# Patient Record
Sex: Female | Born: 1990 | Race: Black or African American | Hispanic: No | Smoking: Current some day smoker
Health system: Southern US, Community
[De-identification: ages and names within clinical notes are randomized; demographics above are authoritative.]

## PROBLEM LIST (undated history)

## (undated) DIAGNOSIS — T50902A Poisoning by unspecified drugs, medicaments and biological substances, intentional self-harm, initial encounter: Secondary | ICD-10-CM

## (undated) DIAGNOSIS — S0181XA Laceration without foreign body of other part of head, initial encounter: Secondary | ICD-10-CM

## (undated) HISTORY — PX: NO PAST SURGERIES: SHX2092

---
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
## 2008-06-20 ENCOUNTER — Other Ambulatory Visit: Payer: Self-pay | Admitting: Emergency Medicine

## 2008-06-21 ENCOUNTER — Observation Stay (HOSPITAL_COMMUNITY): Admission: EM | Admit: 2008-06-21 | Discharge: 2008-06-21 | Payer: Self-pay | Admitting: Emergency Medicine

## 2008-08-04 ENCOUNTER — Ambulatory Visit (HOSPITAL_COMMUNITY): Admission: RE | Admit: 2008-08-04 | Discharge: 2008-08-04 | Payer: Self-pay | Admitting: Obstetrics & Gynecology

## 2008-09-20 ENCOUNTER — Ambulatory Visit: Payer: Self-pay | Admitting: Family Medicine

## 2008-09-20 ENCOUNTER — Inpatient Hospital Stay (HOSPITAL_COMMUNITY): Admission: AD | Admit: 2008-09-20 | Discharge: 2008-09-23 | Payer: Self-pay | Admitting: Obstetrics and Gynecology

## 2008-09-20 ENCOUNTER — Ambulatory Visit: Payer: Self-pay | Admitting: Obstetrics & Gynecology

## 2008-11-16 ENCOUNTER — Encounter: Admission: RE | Admit: 2008-11-16 | Discharge: 2008-11-16 | Payer: Self-pay | Admitting: Family Medicine
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
## 2011-09-12 LAB — BASIC METABOLIC PANEL
Calcium: 8.3 — ABNORMAL LOW
GFR calc Af Amer: >60
GFR calc non Af Amer: >60
Glucose, Bld: 91
Sodium: 137

## 2011-09-12 LAB — DIFFERENTIAL
Basophils Absolute: 0
Lymphocytes Relative: 28
Monocytes Absolute: 0.9
Neutro Abs: 6.2
Neutrophils Relative %: 62

## 2011-09-12 LAB — CBC
Hemoglobin: 8.6 — ABNORMAL LOW
RBC: 2.94 — ABNORMAL LOW
RDW: 13.9

## 2011-09-12 LAB — TYPE AND SCREEN: Antibody Screen: NEGATIVE

## 2011-09-12 LAB — ABO/RH: ABO/RH(D): O POS

## 2011-09-17 LAB — CBC
Hemoglobin: 10.5 — ABNORMAL LOW
Platelets: 212
RDW: 13.6

## 2011-09-17 LAB — RPR: RPR Ser Ql: NONREACTIVE

## 2011-12-07 ENCOUNTER — Encounter: Payer: Self-pay | Admitting: Emergency Medicine

## 2011-12-07 ENCOUNTER — Emergency Department (HOSPITAL_BASED_OUTPATIENT_CLINIC_OR_DEPARTMENT_OTHER)
Admission: EM | Admit: 2011-12-07 | Discharge: 2011-12-07 | Discharge status: Against Medical Advice | Payer: Self-pay | Attending: Emergency Medicine | Admitting: Emergency Medicine

## 2011-12-07 DIAGNOSIS — R109 Unspecified abdominal pain: Secondary | ICD-10-CM | POA: Insufficient documentation

## 2011-12-07 LAB — URINALYSIS, ROUTINE W REFLEX MICROSCOPIC
Bilirubin Urine: NEGATIVE
Ketones, ur: NEGATIVE mg/dL
Nitrite: NEGATIVE
Protein, ur: NEGATIVE mg/dL
Urobilinogen, UA: 1 mg/dL (ref 0.0–1.0)

## 2011-12-07 LAB — PREGNANCY, URINE: Preg Test, Ur: NEGATIVE

## 2011-12-07 NOTE — ED Notes (Signed)
Pt c/o middle lower pelvic pain, worse after intercourse; burning w urination; vaginal discharge

## 2012-08-21 ENCOUNTER — Emergency Department (HOSPITAL_COMMUNITY)
Admission: EM | Admit: 2012-08-21 | Discharge: 2012-08-21 | Disposition: A | Discharge status: Home / Self Care | Payer: Self-pay | Attending: Emergency Medicine | Admitting: Emergency Medicine

## 2012-08-21 ENCOUNTER — Emergency Department (HOSPITAL_COMMUNITY): Payer: Self-pay

## 2012-08-21 ENCOUNTER — Encounter (HOSPITAL_COMMUNITY): Payer: Self-pay

## 2012-08-21 DIAGNOSIS — IMO0002 Reserved for concepts with insufficient information to code with codable children: Secondary | ICD-10-CM | POA: Insufficient documentation

## 2012-08-21 DIAGNOSIS — W19XXXA Unspecified fall, initial encounter: Secondary | ICD-10-CM | POA: Insufficient documentation

## 2012-08-21 DIAGNOSIS — S8390XA Sprain of unspecified site of unspecified knee, initial encounter: Secondary | ICD-10-CM

## 2012-08-21 DIAGNOSIS — M25571 Pain in right ankle and joints of right foot: Secondary | ICD-10-CM

## 2012-08-21 DIAGNOSIS — M79609 Pain in unspecified limb: Secondary | ICD-10-CM | POA: Insufficient documentation

## 2012-08-21 DIAGNOSIS — M25579 Pain in unspecified ankle and joints of unspecified foot: Secondary | ICD-10-CM | POA: Insufficient documentation

## 2012-08-21 DIAGNOSIS — Y92009 Unspecified place in unspecified non-institutional (private) residence as the place of occurrence of the external cause: Secondary | ICD-10-CM | POA: Insufficient documentation

## 2012-08-21 DIAGNOSIS — M79671 Pain in right foot: Secondary | ICD-10-CM

## 2012-08-21 MED ORDER — HYDROCODONE-ACETAMINOPHEN 5-325 MG PO TABS
1.0000 | ORAL_TABLET | Freq: Once | ORAL | Status: AC
  Administered 2012-08-21: 1 via ORAL
  Filled 2012-08-21: qty 1

## 2012-08-21 MED ORDER — IBUPROFEN 800 MG PO TABS: 800.0000 mg | ORAL_TABLET | Freq: Three times a day (TID) | ORAL | Status: AC

## 2012-08-21 MED ORDER — HYDROCODONE-ACETAMINOPHEN 5-325 MG PO TABS: 1.0000 | ORAL_TABLET | Freq: Four times a day (QID) | ORAL | Status: AC | PRN

## 2012-08-21 NOTE — ED Provider Notes (Signed)
History     CSN: [id number]  Arrival date & time 08/21/12  1916   First MD Initiated Contact with Patient 08/21/12 2256      Chief Complaint  Patient presents with  . Leg Pain    (Consider location/radiation/quality/duration/timing/severity/associated sxs/prior treatment) Patient is a 21 y.o. female presenting with leg pain. The history is provided by the patient. No language interpreter was used.  Leg Pain  The incident occurred 6 to 12 hours ago. The incident occurred at home. The injury mechanism was a fall. The pain is present in the right knee, right ankle and right leg. The quality of the pain is described as throbbing. The pain is moderate. The pain has been fluctuating since onset. Associated symptoms include inability to bear weight. She reports no foreign bodies present. The symptoms are aggravated by activity, bearing weight and palpation. She has tried nothing for the symptoms.  patient walking in high heels, stumbled, falling onto hyper-flexed right leg.   History reviewed. No pertinent past medical history.  History reviewed. No pertinent past surgical history.  History reviewed. No pertinent family history.  History  Substance Use Topics  . Smoking status: Never Smoker   . Smokeless tobacco: Not on file  . Alcohol Use: Yes     social    OB History    Grav Para Term Preterm Abortions TAB SAB Ect Mult Living                  Review of Systems  Musculoskeletal: Positive for arthralgias.  All other systems reviewed and are negative.    Allergies  Review of patient's allergies indicates no known allergies.  Home Medications   Current Outpatient Rx  Name Route Sig Dispense Refill  . ACETAMINOPHEN 325 MG PO TABS Oral Take 325 mg by mouth every 6 (six) hours as needed. Headache      BP 126/71  Pulse 84  Temp 98.6 F (37 C) (Oral)  Resp 16  Ht 5\' 6"  (1.676 m)  Wt 165 lb (74.844 kg)  BMI 26.63 kg/m2  SpO2 100%  LMP 07/31/2012  Physical Exam    Constitutional: She is oriented to person, place, and time. She appears well-developed and well-nourished.  HENT:  Head: Normocephalic and atraumatic.  Eyes: Conjunctivae are normal. Pupils are equal, round, and reactive to light.  Neck: Normal range of motion. Neck supple.  Cardiovascular: Normal rate, regular rhythm and normal heart sounds.   Pulmonary/Chest: Effort normal and breath sounds normal.  Abdominal: Soft. Bowel sounds are normal.  Musculoskeletal: She exhibits tenderness.       Right knee: She exhibits decreased range of motion. She exhibits no swelling and no deformity.       Right ankle: She exhibits normal pulse. tenderness.       Feet:  Lymphadenopathy:    She has no cervical adenopathy.  Neurological: She is alert and oriented to person, place, and time.  Skin: Skin is warm and dry.  Psychiatric: She has a normal mood and affect. Her behavior is normal. Judgment and thought content normal.  No obvious swelling noted to ankle or knee.  Mild swelling noted to dorsal surface of foot at base of ankle.  ED Course  Procedures (including critical care time)  Labs Reviewed - No data to display Dg Ankle Complete Right  08/21/2012  *RADIOLOGY REPORT*  Clinical Data:  Fall.  Ankle injury and pain.  RIGHT ANKLE - COMPLETE 3+ VIEW  Comparison:  None.  Findings:  There is no evidence of fracture, dislocation, or joint effusion.  There is no evidence of arthropathy or other focal bone abnormality.  Soft tissues are unremarkable.  IMPRESSION: Negative.   Original Report Authenticated By: Danae Orleans, M.D.    Dg Knee Complete 4 Views Right  08/21/2012  *RADIOLOGY REPORT*  Clinical Data: Fall.  Knee injury and pain.  RIGHT KNEE - COMPLETE 4+ VIEW  Comparison:  None.  Findings:  There is no evidence of fracture, dislocation, or joint effusion.  There is no evidence of arthropathy or other focal bone abnormality.  Soft tissues are unremarkable.  IMPRESSION: Negative.   Original Report  Authenticated By: Danae Orleans, M.D.      No diagnosis found.    MDM  Hyperflexion of right knee, ankle and foot.  Xrays negative for fracture, dislocation, effusion.        Jimmye Norman, NP 08/22/12 [phone]

## 2012-08-21 NOTE — ED Notes (Signed)
Pt was in "very high-high heels" this am causing her to fall back.  She fell back onto her RT leg that was bent as she fell back.  C/O pain to her RT knee, leg, ankle and foot pain.

## 2012-08-22 NOTE — ED Provider Notes (Signed)
Medical screening examination/treatment/procedure(s) were performed by non-physician practitioner and as supervising physician I was immediately available for consultation/collaboration.  Diaz Crago, MD 08/22/12 0128 

## 2014-03-23 ENCOUNTER — Ambulatory Visit: Payer: Self-pay

## 2015-05-02 ENCOUNTER — Inpatient Hospital Stay (HOSPITAL_COMMUNITY)
Admission: AD | Admit: 2015-05-02 | Discharge: 2015-05-02 | Disposition: A | Discharge status: Home / Self Care | Payer: Medicaid Other | Source: Ambulatory Visit | Attending: Family Medicine | Admitting: Family Medicine

## 2015-05-02 ENCOUNTER — Encounter (HOSPITAL_COMMUNITY): Payer: Self-pay | Admitting: *Deleted

## 2015-05-02 DIAGNOSIS — A5901 Trichomonal vulvovaginitis: Secondary | ICD-10-CM | POA: Diagnosis not present

## 2015-05-02 DIAGNOSIS — N939 Abnormal uterine and vaginal bleeding, unspecified: Secondary | ICD-10-CM | POA: Insufficient documentation

## 2015-05-02 DIAGNOSIS — F17210 Nicotine dependence, cigarettes, uncomplicated: Secondary | ICD-10-CM | POA: Insufficient documentation

## 2015-05-02 HISTORY — DX: Laceration without foreign body of other part of head, initial encounter: S01.81XA

## 2015-05-02 HISTORY — DX: Poisoning by unspecified drugs, medicaments and biological substances, intentional self-harm, initial encounter: T50.902A

## 2015-05-02 LAB — CBC
HCT: 40 % (ref 36.0–46.0)
Hemoglobin: 13.3 g/dL (ref 12.0–15.0)
MCH: 29.5 pg (ref 26.0–34.0)
MCHC: 33.3 g/dL (ref 30.0–36.0)
MCV: 88.7 fL (ref 78.0–100.0)
PLATELETS: 237 10*3/uL (ref 150–400)
RBC: 4.51 MIL/uL (ref 3.87–5.11)
RDW: 14 % (ref 11.5–15.5)
WBC: 8.1 10*3/uL (ref 4.0–10.5)

## 2015-05-02 LAB — TSH: TSH: 0.831 u[IU]/mL (ref 0.350–4.500)

## 2015-05-02 LAB — WET PREP, GENITAL
Clue Cells Wet Prep HPF POC: NONE SEEN
Yeast Wet Prep HPF POC: NONE SEEN

## 2015-05-02 LAB — POCT PREGNANCY, URINE: PREG TEST UR: NEGATIVE

## 2015-05-02 MED ORDER — METRONIDAZOLE 500 MG PO TABS
2000.0000 mg | ORAL_TABLET | Freq: Once | ORAL | Status: AC
  Administered 2015-05-02: 2000 mg via ORAL
  Filled 2015-05-02: qty 4

## 2015-05-02 NOTE — MAU Note (Addendum)
PT SAYS SHE STARTED  VAG BLEEDING   AT    0930  WHILE AT  WORK  .    ALSO SAYS ON 5-8   SHE BLED HEAVY  FOR 1-2 DAYS.    NO BIRTH CONTROL.   LAST SEX-   4-16.     GYN-  CARE   IN GYN CLINIC-    [phone].   NO HPT.  IN TRIAGE  - NO PAD  -  UNDERWEAR BLACK-    DIFF  TO SEE -  NONE   ON    PANTS

## 2015-05-02 NOTE — Discharge Instructions (Signed)
Abnormal Uterine Bleeding °Abnormal uterine bleeding can affect women at various stages in life, including teenagers, women in their reproductive years, pregnant women, and women who have reached menopause. Several kinds of uterine bleeding are considered abnormal, including: °· Bleeding or spotting between periods.   °· Bleeding after sexual intercourse.   °· Bleeding that is heavier or more than normal.   °· Periods that last longer than usual. °· Bleeding after menopause.   °Many cases of abnormal uterine bleeding are minor and simple to treat, while others are more serious. Any type of abnormal bleeding should be evaluated by your health care provider. Treatment will depend on the cause of the bleeding. °HOME CARE INSTRUCTIONS °Monitor your condition for any changes. The following actions may help to alleviate any discomfort you are experiencing: °· Avoid the use of tampons and douches as directed by your health care provider. °· Change your pads frequently. °You should get regular pelvic exams and Pap tests. Keep all follow-up appointments for diagnostic tests as directed by your health care provider.  °SEEK MEDICAL CARE IF:  °· Your bleeding lasts more than 1 week.   °· You feel dizzy at times.   °SEEK IMMEDIATE MEDICAL CARE IF:  °· You pass out.   °· You are changing pads every 15 to 30 minutes.   °· You have abdominal pain. °· You have a fever.   °· You become sweaty or weak.   °· You are passing large blood clots from the vagina.   °· You start to feel nauseous and vomit. °MAKE SURE YOU:  °· Understand these instructions. °· Will watch your condition. °· Will get help right away if you are not doing well or get worse. °Document Released: 12/02/2005 Document Revised: 12/07/2013 Document Reviewed: 07/01/2013 °ExitCare® Patient Information ©2015 [employer]. This information is not intended to replace advice given to you by your health care provider. Make sure you discuss any questions you have with your  health care provider. ° ° °Trichomoniasis °Trichomoniasis is an infection caused by an organism called Trichomonas. The infection can affect both women and men. In women, the outer female genitalia and the vagina are affected. In men, the penis is mainly affected, but the prostate and other reproductive organs can also be involved. Trichomoniasis is a sexually transmitted infection (STI) and is most often passed to another person through sexual contact.  °RISK FACTORS °· Having unprotected sexual intercourse. °· Having sexual intercourse with an infected partner. °SIGNS AND SYMPTOMS  °Symptoms of trichomoniasis in women include: °· Abnormal gray-green frothy vaginal discharge. °· Itching and irritation of the vagina. °· Itching and irritation of the area outside the vagina. °Symptoms of trichomoniasis in men include:  °· Penile discharge with or without pain. °· Pain during urination. This results from inflammation of the urethra. °DIAGNOSIS  °Trichomoniasis may be found during a Pap test or physical exam. Your health care provider may use one of the following methods to help diagnose this infection: °· Examining vaginal discharge under a microscope. For men, urethral discharge would be examined. °· Testing the pH of the vagina with a test tape. °· Using a vaginal swab test that checks for the Trichomonas organism. A test is available that provides results within a few minutes. °· Doing a culture test for the organism. This is not usually needed. °TREATMENT  °· You may be given medicine to fight the infection. Women should inform their health care provider if they could be or are pregnant. Some medicines used to treat the infection should not be taken during   pregnancy. °· Your health care provider may recommend over-the-counter medicines or creams to decrease itching or irritation. °· Your sexual partner will need to be treated if infected. °HOME CARE INSTRUCTIONS  °· Take medicines only as directed by your health  care provider. °· Take over-the-counter medicine for itching or irritation as directed by your health care provider. °· Do not have sexual intercourse while you have the infection. °· Women should not douche or wear tampons while they have the infection. °· Discuss your infection with your partner. Your partner may have gotten the infection from you, or you may have gotten it from your partner. °· Have your sex partner get examined and treated if necessary. °· Practice safe, informed, and protected sex. °· See your health care provider for other STI testing. °SEEK MEDICAL CARE IF:  °· You still have symptoms after you finish your medicine. °· You develop abdominal pain. °· You have pain when you urinate. °· You have bleeding after sexual intercourse. °· You develop a rash. °· Your medicine makes you sick or makes you throw up (vomit). °MAKE SURE YOU: °· Understand these instructions. °· Will watch your condition. °· Will get help right away if you are not doing well or get worse. °Document Released: 05/28/2001 Document Revised: 04/18/2014 Document Reviewed: 09/13/2013 °ExitCare® Patient Information ©2015 [employer]. This information is not intended to replace advice given to you by your health care provider. Make sure you discuss any questions you have with your health care provider. ° °
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
## 2015-05-02 NOTE — MAU Note (Signed)
Urine in lab 

## 2015-05-03 LAB — GC/CHLAMYDIA PROBE AMP (~~LOC~~) NOT AT ARMC
Chlamydia: NEGATIVE
Neisseria Gonorrhea: NEGATIVE

## 2015-05-03 LAB — HIV ANTIBODY (ROUTINE TESTING W REFLEX): HIV Screen 4th Generation wRfx: NONREACTIVE

## 2016-11-19 ENCOUNTER — Emergency Department (HOSPITAL_COMMUNITY)
Admission: EM | Admit: 2016-11-19 | Discharge: 2016-11-19 | Disposition: A | Discharge status: Home / Self Care | Payer: Medicaid Other | Attending: Emergency Medicine | Admitting: Emergency Medicine

## 2016-11-19 ENCOUNTER — Emergency Department (HOSPITAL_COMMUNITY): Payer: Medicaid Other

## 2016-11-19 ENCOUNTER — Encounter (HOSPITAL_COMMUNITY): Payer: Self-pay | Admitting: Emergency Medicine

## 2016-11-19 DIAGNOSIS — R51 Headache: Secondary | ICD-10-CM | POA: Diagnosis not present

## 2016-11-19 DIAGNOSIS — S0181XA Laceration without foreign body of other part of head, initial encounter: Secondary | ICD-10-CM

## 2016-11-19 DIAGNOSIS — Y92481 Parking lot as the place of occurrence of the external cause: Secondary | ICD-10-CM | POA: Insufficient documentation

## 2016-11-19 DIAGNOSIS — S022XXA Fracture of nasal bones, initial encounter for closed fracture: Secondary | ICD-10-CM | POA: Diagnosis not present

## 2016-11-19 DIAGNOSIS — F17210 Nicotine dependence, cigarettes, uncomplicated: Secondary | ICD-10-CM | POA: Diagnosis not present

## 2016-11-19 DIAGNOSIS — Y939 Activity, unspecified: Secondary | ICD-10-CM | POA: Diagnosis not present

## 2016-11-19 DIAGNOSIS — S0083XA Contusion of other part of head, initial encounter: Secondary | ICD-10-CM | POA: Diagnosis not present

## 2016-11-19 DIAGNOSIS — S0121XA Laceration without foreign body of nose, initial encounter: Secondary | ICD-10-CM | POA: Diagnosis present

## 2016-11-19 DIAGNOSIS — Y999 Unspecified external cause status: Secondary | ICD-10-CM | POA: Diagnosis not present

## 2016-11-19 LAB — POC URINE PREG, ED: Preg Test, Ur: NEGATIVE

## 2016-11-19 MED ORDER — TRAMADOL HCL 50 MG PO TABS: 50.0000 mg | ORAL_TABLET | Freq: Four times a day (QID) | ORAL | 0 refills | Status: DC | PRN

## 2016-11-19 MED ORDER — LIDOCAINE HCL (PF) 1 % IJ SOLN
10.0000 mL | Freq: Once | INTRAMUSCULAR | Status: AC
  Administered 2016-11-19: 10 mL
  Filled 2016-11-19: qty 10

## 2016-11-19 MED ORDER — ACETAMINOPHEN 500 MG PO TABS
1000.0000 mg | ORAL_TABLET | Freq: Once | ORAL | Status: AC
  Administered 2016-11-19: 1000 mg via ORAL
  Filled 2016-11-19: qty 2

## 2016-11-19 MED ORDER — LIDOCAINE HCL (PF) 1 % IJ SOLN
5.0000 mL | Freq: Once | INTRAMUSCULAR | Status: AC
  Administered 2016-11-19: 5 mL
  Filled 2016-11-19: qty 5
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
## 2017-01-27 ENCOUNTER — Encounter (HOSPITAL_COMMUNITY): Payer: Self-pay | Admitting: Emergency Medicine

## 2017-01-27 ENCOUNTER — Emergency Department (HOSPITAL_COMMUNITY)
Admission: EM | Admit: 2017-01-27 | Discharge: 2017-01-27 | Disposition: A | Discharge status: Home / Self Care | Payer: Medicaid Other | Attending: Emergency Medicine | Admitting: Emergency Medicine

## 2017-01-27 DIAGNOSIS — F17210 Nicotine dependence, cigarettes, uncomplicated: Secondary | ICD-10-CM | POA: Diagnosis not present

## 2017-01-27 DIAGNOSIS — R21 Rash and other nonspecific skin eruption: Secondary | ICD-10-CM

## 2017-01-27 MED ORDER — HYDROXYZINE HCL 10 MG PO TABS: 10.0000 mg | ORAL_TABLET | Freq: Three times a day (TID) | ORAL | 0 refills | Status: DC | PRN

## 2017-01-27 MED ORDER — TRIAMCINOLONE ACETONIDE 0.5 % EX OINT: 1.0000 "application " | TOPICAL_OINTMENT | Freq: Two times a day (BID) | CUTANEOUS | 0 refills | Status: DC

## 2017-01-27 NOTE — ED Triage Notes (Signed)
Patient c/o rash on legs and itching.  Patient has been having rash on posterior legs and itching all over since Thursday. patient reports using new detergent this month but nothing else has changed.  Patient denies taking any medications for it.
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
## 2017-01-27 NOTE — Discharge Instructions (Signed)
Avoid hot showers Use plenty of lotion Use steroid ointment and Hydroxyzine for itching

## 2017-02-14 ENCOUNTER — Emergency Department (HOSPITAL_COMMUNITY)
Admission: EM | Admit: 2017-02-14 | Discharge: 2017-02-14 | Discharge status: Against Medical Advice | Payer: Medicaid Other

## 2017-02-14 ENCOUNTER — Encounter (HOSPITAL_COMMUNITY): Payer: Self-pay | Admitting: *Deleted

## 2017-02-14 ENCOUNTER — Ambulatory Visit (HOSPITAL_COMMUNITY)
Admission: EM | Admit: 2017-02-14 | Discharge: 2017-02-14 | Disposition: A | Discharge status: Home / Self Care | Payer: Medicaid Other | Attending: Family Medicine | Admitting: Family Medicine

## 2017-02-14 DIAGNOSIS — L739 Follicular disorder, unspecified: Secondary | ICD-10-CM

## 2017-02-14 MED ORDER — TRIAMCINOLONE ACETONIDE 0.1 % EX CREA: 1.0000 "application " | TOPICAL_CREAM | Freq: Two times a day (BID) | CUTANEOUS | 0 refills | Status: DC

## 2017-02-14 MED ORDER — HYDROXYZINE HCL 10 MG PO TABS: 10.0000 mg | ORAL_TABLET | Freq: Three times a day (TID) | ORAL | 0 refills | Status: AC | PRN

## 2017-02-14 MED ORDER — DOXYCYCLINE HYCLATE 100 MG PO CAPS: 100.0000 mg | ORAL_CAPSULE | Freq: Two times a day (BID) | ORAL | 0 refills | Status: DC

## 2017-02-14 NOTE — ED Triage Notes (Signed)
Pt  Reports   Rash  On    Various  Parts  Of  Body  Actually  Worse  On   Hands/   Feet       X  1  Month         The rash  Itches     Pt  States   [place]  [place]  And  Given  meds  That did  Not  Clear  It  Up

## 2017-02-14 NOTE — ED Notes (Signed)
Pt called for triage, no response from lobby 
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
## 2017-02-14 NOTE — ED Notes (Signed)
Pt did not reply for triage

## 2017-02-14 NOTE — ED Notes (Signed)
No response x2 ?

## 2017-02-14 NOTE — Discharge Instructions (Signed)
I'm treating you today for folliculitis. I prescribed doxycycline, take one tablet twice a day for 10 days. For itch, prescribed hydroxyzine, take one tab by mouth 3 times a day when necessary. Have also prescribed triamcinolone cream, apply to affected area twice a day as needed. Should your symptoms persist, recommend following up with dermatologist, or return to clinic as needed.

## 2017-02-28 ENCOUNTER — Emergency Department (HOSPITAL_BASED_OUTPATIENT_CLINIC_OR_DEPARTMENT_OTHER)
Admission: EM | Admit: 2017-02-28 | Discharge: 2017-02-28 | Disposition: A | Discharge status: Home / Self Care | Payer: Medicaid Other | Attending: Emergency Medicine | Admitting: Emergency Medicine

## 2017-02-28 ENCOUNTER — Encounter (HOSPITAL_BASED_OUTPATIENT_CLINIC_OR_DEPARTMENT_OTHER): Payer: Self-pay

## 2017-02-28 DIAGNOSIS — F17210 Nicotine dependence, cigarettes, uncomplicated: Secondary | ICD-10-CM | POA: Insufficient documentation

## 2017-02-28 DIAGNOSIS — B86 Scabies: Secondary | ICD-10-CM | POA: Insufficient documentation

## 2017-02-28 MED ORDER — PERMETHRIN 5 % EX CREA: TOPICAL_CREAM | CUTANEOUS | 1 refills | Status: DC
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
## 2017-02-28 NOTE — Discharge Instructions (Signed)
Apply the cream to the entire body and leave on for 8-14 hours. Wash off completely. Apply the cream again in 1 week, leave on for 8-14 hours, and then rinse completely. Medications like Benadryl may help for itching. Wash all sheets, towels, and clothing in as hot of water possible. Use bleach, if possible.  °If this does not solve the rash/itching, please follow up with a primary care provider or dermatologist. °

## 2017-02-28 NOTE — ED Triage Notes (Signed)
Pt reports rash to bilateral feet, hands, itchy, worse at night, boyfriend has scabies - states seen multiple times but not given proper treatment.

## 2017-03-27 ENCOUNTER — Encounter (HOSPITAL_BASED_OUTPATIENT_CLINIC_OR_DEPARTMENT_OTHER): Payer: Self-pay | Admitting: Emergency Medicine

## 2017-03-27 ENCOUNTER — Emergency Department (HOSPITAL_BASED_OUTPATIENT_CLINIC_OR_DEPARTMENT_OTHER)
Admission: EM | Admit: 2017-03-27 | Discharge: 2017-03-27 | Disposition: A | Discharge status: Home / Self Care | Payer: Medicaid Other | Attending: Emergency Medicine | Admitting: Emergency Medicine

## 2017-03-27 DIAGNOSIS — F17210 Nicotine dependence, cigarettes, uncomplicated: Secondary | ICD-10-CM | POA: Insufficient documentation

## 2017-03-27 DIAGNOSIS — R21 Rash and other nonspecific skin eruption: Secondary | ICD-10-CM

## 2017-03-27 DIAGNOSIS — L299 Pruritus, unspecified: Secondary | ICD-10-CM | POA: Insufficient documentation

## 2017-03-27 DIAGNOSIS — Z79899 Other long term (current) drug therapy: Secondary | ICD-10-CM | POA: Insufficient documentation

## 2017-03-27 MED ORDER — CETIRIZINE HCL 10 MG PO TABS: 10.0000 mg | ORAL_TABLET | Freq: Every day | ORAL | 0 refills | Status: DC

## 2017-03-27 MED ORDER — PREDNISONE 20 MG PO TABS: ORAL_TABLET | ORAL | 0 refills | Status: DC

## 2017-03-27 NOTE — Discharge Instructions (Signed)
Take prednisone as directed.  You may use zyrtec or benadryl
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
## 2017-03-27 NOTE — ED Triage Notes (Signed)
Patient states that the treatment that she was given for scabies helped. Reports that here and her daughter are now itching again
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
## 2017-08-05 ENCOUNTER — Emergency Department (HOSPITAL_BASED_OUTPATIENT_CLINIC_OR_DEPARTMENT_OTHER)
Admission: EM | Admit: 2017-08-05 | Discharge: 2017-08-05 | Disposition: A | Discharge status: Home / Self Care | Payer: Medicaid Other | Attending: Emergency Medicine | Admitting: Emergency Medicine

## 2017-08-05 ENCOUNTER — Encounter (HOSPITAL_BASED_OUTPATIENT_CLINIC_OR_DEPARTMENT_OTHER): Payer: Self-pay | Admitting: *Deleted

## 2017-08-05 DIAGNOSIS — F17210 Nicotine dependence, cigarettes, uncomplicated: Secondary | ICD-10-CM | POA: Insufficient documentation

## 2017-08-05 DIAGNOSIS — R21 Rash and other nonspecific skin eruption: Secondary | ICD-10-CM | POA: Insufficient documentation

## 2017-08-05 MED ORDER — PERMETHRIN 5 % EX CREA
TOPICAL_CREAM | CUTANEOUS | 0 refills | Status: DC
Start: 2017-08-05 — End: 2020-02-12

## 2017-08-05 NOTE — Discharge Instructions (Signed)
Follow instructions attached. Follow up with PCP in 1 week. If you develop worsening or new concerning symptoms you can return to the emergency department for re-evaluation.

## 2017-08-05 NOTE — ED Notes (Signed)
ED Provider at bedside. 

## 2017-08-05 NOTE — ED Triage Notes (Signed)
Scabies, reports hx of same.
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
## 2017-08-05 NOTE — ED Notes (Signed)
Pt requesting something cheaper, EDP printed out coupon for her and she was also informed about the OTC options.

## 2018-04-20 ENCOUNTER — Emergency Department (HOSPITAL_BASED_OUTPATIENT_CLINIC_OR_DEPARTMENT_OTHER)
Admission: EM | Admit: 2018-04-20 | Discharge: 2018-04-20 | Disposition: A | Discharge status: Home / Self Care | Payer: Self-pay | Attending: Emergency Medicine | Admitting: Emergency Medicine

## 2018-04-20 ENCOUNTER — Encounter (HOSPITAL_BASED_OUTPATIENT_CLINIC_OR_DEPARTMENT_OTHER): Payer: Self-pay | Admitting: Emergency Medicine

## 2018-04-20 ENCOUNTER — Other Ambulatory Visit: Payer: Self-pay

## 2018-04-20 DIAGNOSIS — F17210 Nicotine dependence, cigarettes, uncomplicated: Secondary | ICD-10-CM | POA: Insufficient documentation

## 2018-04-20 DIAGNOSIS — Z3202 Encounter for pregnancy test, result negative: Secondary | ICD-10-CM | POA: Insufficient documentation

## 2018-04-20 DIAGNOSIS — R21 Rash and other nonspecific skin eruption: Secondary | ICD-10-CM | POA: Insufficient documentation

## 2018-04-20 LAB — PREGNANCY, URINE: Preg Test, Ur: NEGATIVE

## 2018-04-20 MED ORDER — PREDNISONE 20 MG PO TABS: 40.0000 mg | ORAL_TABLET | Freq: Every day | ORAL | 0 refills | Status: AC

## 2018-04-20 MED ORDER — CETIRIZINE HCL 10 MG PO TABS: 10.0000 mg | ORAL_TABLET | Freq: Every day | ORAL | 1 refills | Status: DC

## 2018-04-20 MED FILL — predniSONE 20 MG TABS: 20 | 5 days supply | Qty: 10 | Fill #0

## 2018-04-20 MED FILL — CETIRIZINE HCL 10 MG TABLET: 10 | 100 days supply | Qty: 100 | Fill #0

## 2018-04-20 NOTE — ED Provider Notes (Addendum)
[facility] EMERGENCY DEPARTMENT Provider Note   CSN: [id number] Arrival date & time: 04/20/18  1007     History   Chief Complaint Chief Complaint  Patient presents with  . Rash    HPI Olivia Duarte is a 27 y.o. female.  HPI Olivia T Tesfaye is a 27 y.o. female with PMH of suicide attempt who presents to ED for rash on flexor and extensor surfaces of arms, posterior neck, and face which began 4 days ago.  Patient describes the rash as bumps that are pruritic in nature.  No spreading erythema.  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  Patient reports that according to her supervisor, others have had similar reactions in the past to the activator and the glue.  Patient denies any other similar exposure.  Patient denies any new medications, recent travel or outdoor activity, no new lotions, soaps, detergents.  Patient denies any fevers, reports she was told on Saturday which resolved, no oral swelling, sore throat, cough, abdominal pain, nausea vomiting, diarrhea, arthralgias.  Patient reports that her left eye was slightly swollen in the periorbital tissue 2 days ago but this spontaneously resolved.  Patient reports she has tried over-the-counter Benadryl as well as a an unknown allergy cream without relief.  Past Medical History:  Diagnosis Date  . Facial laceration 2009  . Suicide attempt by drug ingestion (HCC) 2006   Corrosive cleaner ingestion and nutritional supplement ingestion    There are no active problems to display for this patient.   Past Surgical History:  Procedure Laterality Date  . NO PAST SURGERIES       OB History    Gravida  1   Para  1   Term  1   Preterm      AB      Living  1     SAB      TAB      Ectopic      Multiple      Live Births               Home Medications    Prior to  Admission medications   Medication Sig Start Date End Date Taking? Authorizing Provider  permethrin (ELIMITE) 5 % cream Apply cream, leave on for 8 to 14 hours, then wash with soap/water, repeat application if living mites present 14 days after initial treatment 08/05/17   Maczis, Elmer Sow, PA-C    Family History Family History  Problem Relation Age of Onset  . Alcohol abuse Neg Hx     Social History Social History   Tobacco Use  . Smoking status: Current Some Day Smoker    Types: Cigarettes  . Smokeless tobacco: Never Used  Substance Use Topics  . Alcohol use: Yes    Comment: social  . Drug use: No     Allergies   Patient has no known allergies.   Review of Systems Review of Systems  Constitutional: Negative for chills and fever.  HENT: Positive for congestion and rhinorrhea. Negative for trouble swallowing and voice change.   Eyes: Positive for itching. Negative for redness.  Respiratory: Negative for shortness of breath and wheezing.   Cardiovascular: Negative for chest pain.  Gastrointestinal: Negative for abdominal pain, nausea and vomiting.  Musculoskeletal: Negative for arthralgias and joint swelling.  Skin: Positive  for rash. Negative for color change.     Physical Exam Updated Vital Signs BP 97/69 (BP Location: Left Arm)   Pulse 75   Temp 98 F (36.7 C) (Oral)   Resp 18   Ht  (1.676 m)   Wt 85.7 kg (189 lb)   LMP 04/05/2018   SpO2 100%   BMI 30.51 kg/m   Physical Exam  Constitutional: She appears well-developed and well-nourished. No distress.  Sitting comfortably in bed.  HENT:  Head: Normocephalic and atraumatic.  Eyes: Conjunctivae are normal. Right eye exhibits no discharge. Left eye exhibits no discharge.  EOMs normal to gross examination.  Neck: Normal range of motion.  Cardiovascular: Normal rate and regular rhythm.  Pulmonary/Chest: Effort normal and breath sounds normal. No stridor. No respiratory distress. She has no wheezes.  She has no rales.  Normal respiratory effort. Patient converses comfortably. No audible wheeze or stridor.  Abdominal: She exhibits no distension.  Musculoskeletal: Normal range of motion.  Neurological: She is alert.  Cranial nerves intact to gross observation. Patient moves extremities without difficulty.  Skin: Skin is warm and dry. Rash noted. She is not diaphoretic.  Flexor and extensor surfaces of the bilateral arms exhibit maculopapular eruptions at spare the palms.  Face exhibits small maculopapular lesions without erythema.  No lesions on the back of the neck, trunk, or legs.  Psychiatric: She has a normal mood and affect. Her behavior is normal. Judgment and thought content normal.  Nursing note and vitals reviewed.    ED Treatments / Results  Labs (all labs ordered are listed, but only abnormal results are displayed) Labs Reviewed  PREGNANCY, URINE    EKG None  Radiology No results found.  Procedures Procedures (including critical care time)  Medications Ordered in ED Medications - No data to display   Initial Impression / Assessment and Plan / ED Course  I have reviewed the triage vital signs and the nursing notes.  Pertinent labs & imaging results that were available during my care of the patient were reviewed by me and considered in my medical decision making (see chart for details).     Patient is nontoxic-appearing, afebrile, and in no acute distress.  Suspect contact dermatitis secondary to the new tasks in her job and include an activator and agree that frequent to get sprayed onto her skin.  Examination is not consistent with scabies.  Do not suspect anaphylaxis, SJS, TEN, Lyme disease, RMSF, meningitis, or other cutaneous manifestations of systemic disease.  As patient has active involvement on the face, will proceed with prednisone, and Zyrtec.  Discussed with patient not using steroid creams on the face.  Patient return precautions for any oral  involvement, lip swelling, difficulty breathing, or abdominal cramping.  Primary care resources dispensed patient.  Patient is in understanding and agrees with plan of care.  Final Clinical Impressions(s) / ED Diagnoses   Final diagnoses:  Rash and nonspecific skin eruption    ED Discharge Orders        Ordered    predniSONE (DELTASONE) 20 MG tablet  Daily     04/20/18 1450    cetirizine (ZYRTEC) 10 MG tablet  Daily     04/20/18 1450       Elisha Ponder, PA-C 04/20/18 1453    Delia Chimes 04/20/18 1454    Mesner, Barbara Cower, MD 04/20/18 1629

## 2018-04-20 NOTE — ED Triage Notes (Signed)
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

## 2018-04-20 NOTE — Discharge Instructions (Addendum)
Please see the information and instructions below regarding your visit.  Your diagnoses today include:  1. Rash and nonspecific skin eruption    Your examination is reassuring today.  The distributive your rash is consistent with a reaction to something touching her skin.  I recommend trialing a new work position, or wearing full sleeves and facemask while working with the activator of the glue.  Tests performed today include: See side panel of your discharge paperwork for testing performed today. Vital signs are listed at the bottom of these instructions.   Medications prescribed:    Take any prescribed medications only as prescribed, and any over the counter medications only as directed on the packaging.  You are prescribed prednisone, a steroid. This is a medication to help reduce inflammation.  Common side effects include upset stomach/nausea. You may take this medicine with food if this occurs. Other side effects include restlessness, difficulty sleeping, and increased sweating. Call your healthcare provider if these do not resolve after finishing the medication.  This medicine may increase your blood sugar so additional careful monitoring is needed of blood sugar if you have diabetes. Call your healthcare provider for any signs/symtpoms of high blood sugar such as confusion, feeling sleepy, more thirst, more hunger, passing urine more often, flushing, fast breathing, or breath that smells like fruit.  Zyrtec as an antihistamine that should not make you drowsy.  It should help with seasonal allergy symptoms as well as allergy to substances.  Home care instructions:  Please follow any educational materials contained in this packet.   Follow-up instructions: Please follow-up with your primary care provider in as soon as possible to establish care.  Return instructions:  Please return to the Emergency Department if you experience worsening symptoms.  Please return to the emergency  department if you develop any worsening rash, high fevers with rash, rash on palms or soles, difficulty breathing, difficulty swallowing, wheezing, or abdominal cramping.  Please return if you have any other emergent concerns.  Additional Information:   Your vital signs today were: BP 97/69 (BP Location: Left Arm)    Pulse 75    Temp 98 F (36.7 C) (Oral)    Resp 18    Ht  (1.676 m)    Wt 85.7 kg (189 lb)    LMP 04/05/2018    SpO2 100%    BMI 30.51 kg/m  If your blood pressure (BP) was elevated on multiple readings during this visit above 130 for the top number or above 80 for the bottom number, please have this repeated by your primary care provider within one month. --------------  Thank you for allowing [place] to participate in your care today.

## 2019-08-13 ENCOUNTER — Encounter (HOSPITAL_COMMUNITY): Payer: Self-pay | Admitting: Emergency Medicine

## 2019-08-13 ENCOUNTER — Emergency Department (HOSPITAL_COMMUNITY)
Admission: EM | Admit: 2019-08-13 | Discharge: 2019-08-13 | Disposition: A | Discharge status: Home / Self Care | Payer: Self-pay | Attending: Emergency Medicine | Admitting: Emergency Medicine

## 2019-08-13 ENCOUNTER — Other Ambulatory Visit: Payer: Self-pay

## 2019-08-13 DIAGNOSIS — J0100 Acute maxillary sinusitis, unspecified: Secondary | ICD-10-CM | POA: Insufficient documentation

## 2019-08-13 DIAGNOSIS — F17210 Nicotine dependence, cigarettes, uncomplicated: Secondary | ICD-10-CM | POA: Insufficient documentation

## 2019-08-13 NOTE — ED Triage Notes (Signed)
Pt reports that she had nasal congestion and sinus pain since Tuesday. Reports that hx sinus infections but needs a note before she can go back to work.

## 2019-08-13 NOTE — Discharge Instructions (Signed)
Continue to treat your symptoms supportively with decongestants, Tylenol, you can also try Zyrtec and Flonase.  Follow-up with your primary care provider if symptoms or not improving.  If you develop fevers, severe facial pain, cough, shortness of breath or any other new or concerning symptoms return to the ED.

## 2019-08-13 NOTE — ED Provider Notes (Signed)
[facility]-EMERGENCY DEPT Provider Note   CSN: [id number] Arrival date & time: 08/13/19  1459     History   Chief Complaint Chief Complaint  Patient presents with  . Facial Pain  . Nasal Congestion    HPI Olivia Duarte Thelma BargeFrancis is a 28 y.o. female.     Olivia Duarte is a 28 y.o. female who is otherwise healthy, presents to the ED for evaluation of nasal congestion and sinus pressure.  Symptoms started Tuesday morning, patient reports that she has pressure over her maxillary and frontal sinuses, has had some postnasal drip.  No associated fevers or chills.  No cough, sore throat, chest pain or shortness of breath.  No nausea, vomiting, diarrhea or abdominal pain.  Symptoms are consistent with previous sinus infections, she has been treating with over-the-counter decongestants and she already reports significant improvement in her symptoms.  No known sick contacts, specifically she has not been in contact with anyone with symptoms or positive COVID-19 test.  She reports that she needs a note saying that she can return to work on Monday.     Past Medical History:  Diagnosis Date  . Facial laceration 2009  . Suicide attempt by drug ingestion (HCC) 2006   Corrosive cleaner ingestion and nutritional supplement ingestion    There are no active problems to display for this patient.   Past Surgical History:  Procedure Laterality Date  . NO PAST SURGERIES       OB History    Gravida  1   Para  1   Term  1   Preterm      AB      Living  1     SAB      TAB      Ectopic      Multiple      Live Births               Home Medications    Prior to Admission medications   Medication Sig Start Date End Date Taking? Authorizing Provider  cetirizine (ZYRTEC) 10 MG tablet Take 1 tablet (10 mg total) by mouth daily. 04/20/18 05/20/18  Aviva KluverMurray, Alyssa B, PA-C  permethrin (ELIMITE) 5 % cream Apply cream, leave on for 8 to 14 hours, then wash with soap/water,  repeat application if living mites present 14 days after initial treatment 08/05/17   Maczis, Elmer SowMichael M, PA-C    Family History Family History  Problem Relation Age of Onset  . Alcohol abuse Neg Hx     Social History Social History   Tobacco Use  . Smoking status: Current Some Day Smoker    Types: Cigarettes  . Smokeless tobacco: Never Used  Substance Use Topics  . Alcohol use: Yes    Comment: social  . Drug use: No     Allergies   Patient has no known allergies.   Review of Systems Review of Systems  Constitutional: Negative for chills and fever.  HENT: Positive for congestion, postnasal drip, rhinorrhea, sinus pressure and sinus pain. Negative for ear pain and sore throat.   Eyes: Negative for visual disturbance.  Respiratory: Negative for cough and shortness of breath.   Cardiovascular: Negative for chest pain.  Gastrointestinal: Negative for abdominal pain, diarrhea, nausea and vomiting.  Skin: Negative for color change and rash.  Neurological: Negative for headaches.     Physical Exam Updated Vital Signs BP 127/83 (BP Location: Left Arm)   Pulse 92   Temp 99.1 F (  37.3 C) (Oral)   Resp 17   LMP 08/06/2019   SpO2 100%   Physical Exam Vitals signs and nursing note reviewed.  Constitutional:      General: She is not in acute distress.    Appearance: She is well-developed. She is not ill-appearing or diaphoretic.  HENT:     Head: Normocephalic and atraumatic.     Nose:     Comments: Bilateral nares patent with moderate mucosal edema and clear rhinorrhea present.  Tenderness over maxillary and frontal sinuses.    Mouth/Throat:     Comments: Posterior oropharynx clear and mucous membranes moist, there is mild erythema but no edema or tonsillar exudates, uvula midline, normal phonation, no trismus, tolerating secretions without difficulty. Eyes:     General:        Right eye: No discharge.        Left eye: No discharge.  Neck:     Musculoskeletal: Neck  supple.     Comments: No rigidity Cardiovascular:     Rate and Rhythm: Normal rate and regular rhythm.     Heart sounds: Normal heart sounds.  Pulmonary:     Effort: Pulmonary effort is normal. No respiratory distress.     Breath sounds: Normal breath sounds.     Comments: Respirations equal and unlabored, patient able to speak in full sentences, lungs clear to auscultation bilaterally Abdominal:     General: Bowel sounds are normal. There is no distension.     Palpations: Abdomen is soft. There is no mass.     Tenderness: There is no abdominal tenderness. There is no guarding.     Comments: Abdomen soft, nondistended, nontender to palpation in all quadrants without guarding or peritoneal signs  Musculoskeletal:        General: No deformity.  Lymphadenopathy:     Cervical: No cervical adenopathy.  Skin:    General: Skin is warm and dry.     Capillary Refill: Capillary refill takes less than 2 seconds.  Neurological:     Mental Status: She is alert.  Psychiatric:        Mood and Affect: Mood normal.        Behavior: Behavior normal.      ED Treatments / Results  Labs (all labs ordered are listed, but only abnormal results are displayed) Labs Reviewed - No data to display  EKG None  Radiology No results found.  Procedures Procedures (including critical care time)  Medications Ordered in ED Medications - No data to display   Initial Impression / Assessment and Plan / ED Course  I have reviewed the triage vital signs and the nursing notes.  Pertinent labs & imaging results that were available during my care of the patient were reviewed by me and considered in my medical decision making (see chart for details).  Patient complaining of symptoms of sinusitis.     Mild to moderate symptoms of clear/yellow nasal discharge/congestion and scratchy throat with cough for less than 10 days.  Symptoms are already improving with over-the-counter treatment.  Patient is afebrile.   No concern for acute bacterial rhinosinusitis; likely viral in nature.  Patient discharged with symptomatic treatment.  Patient instructions given for warm saline nasal washes.  Recommendations for follow-up with primary care physician.  Provided patient with note saying that she could return to work on Monday.  Return precautions discussed.   Final Clinical Impressions(s) / ED Diagnoses   Final diagnoses:  Acute non-recurrent maxillary sinusitis    ED  Discharge Orders    None       Jacqlyn Larsen, [place] 08/13/19 1624    Virgel Manifold, MD 08/16/19 1051

## 2019-12-14 ENCOUNTER — Encounter (HOSPITAL_COMMUNITY): Payer: Self-pay

## 2019-12-14 ENCOUNTER — Other Ambulatory Visit: Payer: Self-pay

## 2019-12-14 ENCOUNTER — Emergency Department (HOSPITAL_COMMUNITY)
Admission: EM | Admit: 2019-12-14 | Discharge: 2019-12-14 | Disposition: A | Discharge status: Home / Self Care | Payer: Self-pay | Attending: Emergency Medicine | Admitting: Emergency Medicine

## 2019-12-14 DIAGNOSIS — R05 Cough: Secondary | ICD-10-CM | POA: Insufficient documentation

## 2019-12-14 DIAGNOSIS — F17210 Nicotine dependence, cigarettes, uncomplicated: Secondary | ICD-10-CM | POA: Insufficient documentation

## 2019-12-14 DIAGNOSIS — R059 Cough, unspecified: Secondary | ICD-10-CM

## 2019-12-14 DIAGNOSIS — R0981 Nasal congestion: Secondary | ICD-10-CM | POA: Insufficient documentation

## 2019-12-14 NOTE — Discharge Instructions (Addendum)
You came to the ER with cough and congestion.  You asked for a medical note clearing you to return to work, stipulating that you are not infectious.  I explained that, while I think you are physically able to continue doing your job, I CANNOT verify that you don't have an infectious virus - or that you may have an illness you can spread to others.  I cannot provide that as a medical document to you.  In the future for medical clearance notes, please follow up with your primary care doctor.

## 2019-12-14 NOTE — ED Triage Notes (Signed)
Pt states that her coworker got COVID, and she has had 2 negative tests. However, since the pt has nasal congestion, her work is requiring her to come in and receive a work note to be cleared

## 2019-12-14 NOTE — ED Provider Notes (Signed)
[facility] DEPT Provider Note   CSN: [id number] Arrival date & time: 12/14/19  7253     History Chief Complaint  Patient presents with  . Letter for School/Work    Olivia Duarte is a 28 y.o. female presented to the emergency department requesting a work note.  Patient reports that she had a known Covid positive contact at work 2 weeks ago.  She says she self quarantine and subsequently had 2 Covid test done, each of which were negative, most recently few days ago.  She returned to work.  However this weekend she developed nasal congestion and a cough.  She believes that it has allergies or a common cold, but states that her workplace requires her to come into the ED to be seen by a doctor for medical clearance.  She says, "I need a note saying that I'm not contagious and I can go back to work."  HPI     Past Medical History:  Diagnosis Date  . Facial laceration 2009  . Suicide attempt by drug ingestion ([place]) 2006   Corrosive cleaner ingestion and nutritional supplement ingestion    There are no problems to display for this patient.   Past Surgical History:  Procedure Laterality Date  . NO PAST SURGERIES       OB History    Gravida  1   Para  1   Term  1   Preterm      AB      Living  1     SAB      TAB      Ectopic      Multiple      Live Births              Family History  Problem Relation Age of Onset  . Alcohol abuse Neg Hx     Social History   Tobacco Use  . Smoking status: Current Some Day Smoker    Types: Cigarettes  . Smokeless tobacco: Never Used  Substance Use Topics  . Alcohol use: Yes    Comment: social  . Drug use: No    Home Medications Prior to Admission medications   Medication Sig Start Date End Date Taking? Authorizing Provider  cetirizine (ZYRTEC) 10 MG tablet Take 1 tablet (10 mg total) by mouth daily. 04/20/18 05/20/18  Langston Masker B, PA-C  permethrin (ELIMITE) 5 % cream Apply  cream, leave on for 8 to 14 hours, then wash with soap/water, repeat application if living mites present 14 days after initial treatment 08/05/17   Jillyn Ledger, PA-C    Allergies    Patient has no known allergies.  Review of Systems   Review of Systems  Constitutional: Negative for chills and fever.  HENT: Positive for congestion and rhinorrhea.   Respiratory: Positive for cough and shortness of breath.   Cardiovascular: Negative for chest pain and palpitations.  Psychiatric/Behavioral: Negative for agitation and confusion.    Physical Exam Updated Vital Signs BP 135/85   Pulse 90   Temp 98.4 F (36.9 C)   Resp 16   LMP 11/30/2019   SpO2 99%   Physical Exam Vitals and nursing note reviewed.  Constitutional:      General: She is not in acute distress.    Appearance: She is well-developed.  HENT:     Head: Normocephalic and atraumatic.  Eyes:     Conjunctiva/sclera: Conjunctivae normal.  Cardiovascular:     Rate and Rhythm: Normal  rate and regular rhythm.     Heart sounds: No murmur.  Pulmonary:     Effort: Pulmonary effort is normal. No respiratory distress.     Breath sounds: Normal breath sounds.  Musculoskeletal:     Cervical back: Neck supple.  Skin:    General: Skin is warm and dry.  Neurological:     Mental Status: She is alert.  Psychiatric:        Mood and Affect: Mood normal.        Behavior: Behavior normal.     ED Results / Procedures / Treatments   Labs (all labs ordered are listed, but only abnormal results are displayed) Labs Reviewed - No data to display  EKG None  Radiology No results found.  Procedures Procedures (including critical care time)  Medications Ordered in ED Medications - No data to display  ED Course  I have reviewed the triage vital signs and the nursing notes.  Pertinent labs & imaging results that were available during my care of the patient were reviewed by me and considered in my medical decision making  (see chart for details).  28 yo female presenting to the ED with cough and congestion, requesting a medical note stipulating that she can return to work.  Specifically she has asked me to specify that she is not contagious to others and can return to work.  I explained to her that, while I believe she can physically return to perform her duties, there is no way I can stipulate that she does not have an infectious viral illness.  She may have been re-exposed to Covid since her last test without knowing it, or she may have another viral infection, both of which may be infectious.  There is no way for me to clinically determine this even with her benign physical exam.  I cannot provide this type of note to her.  Final Clinical Impression(s) / ED Diagnoses Final diagnoses:  Head congestion  Cough    Rx / DC Orders ED Discharge Orders    None       Terald Sleeper, MD 12/14/19 1016

## 2020-01-11 ENCOUNTER — Other Ambulatory Visit: Payer: Self-pay

## 2020-01-11 ENCOUNTER — Encounter (HOSPITAL_COMMUNITY): Payer: Self-pay | Admitting: Urgent Care

## 2020-01-11 ENCOUNTER — Ambulatory Visit (HOSPITAL_COMMUNITY)
Admission: EM | Admit: 2020-01-11 | Discharge: 2020-01-11 | Disposition: A | Discharge status: Home / Self Care | Payer: Self-pay | Attending: Urgent Care | Admitting: Urgent Care

## 2020-01-11 DIAGNOSIS — K0889 Other specified disorders of teeth and supporting structures: Secondary | ICD-10-CM

## 2020-01-11 DIAGNOSIS — K047 Periapical abscess without sinus: Secondary | ICD-10-CM

## 2020-01-11 MED ORDER — NAPROXEN 500 MG PO TABS: 500.0000 mg | ORAL_TABLET | Freq: Two times a day (BID) | ORAL | 0 refills | Status: DC

## 2020-01-11 MED ORDER — AMOXICILLIN-POT CLAVULANATE 875-125 MG PO TABS: 1.0000 | ORAL_TABLET | Freq: Two times a day (BID) | ORAL | 0 refills | Status: AC
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
## 2020-01-11 NOTE — ED Triage Notes (Signed)
Pt states she has bad tooth. Pt states it's been going for week.

## 2020-01-11 NOTE — Discharge Instructions (Signed)
Use Sensodyne for tooth paste and also local pain relief.   [facility] [phone] extension [phone]  Dr. Lawrence Marseilles [phone] [address]  [place] [phone] [address] [facility] [place].  [facility] [phone] extension [phone] [place], [place], [address] First come first serve for the first 10 clients.  May do simple extractions only, no wisdom teeth or surgery.  You may try the second for Thursday of the month starting at 6:30 AM.  [facility] You may call the school to see if they are still helping to provide dental care for emergent cases.

## 2020-02-12 ENCOUNTER — Emergency Department (HOSPITAL_COMMUNITY): Payer: Self-pay

## 2020-02-12 ENCOUNTER — Other Ambulatory Visit: Payer: Self-pay

## 2020-02-12 ENCOUNTER — Emergency Department (HOSPITAL_COMMUNITY)
Admission: EM | Admit: 2020-02-12 | Discharge: 2020-02-12 | Disposition: A | Discharge status: Home / Self Care | Payer: Self-pay | Attending: Emergency Medicine | Admitting: Emergency Medicine

## 2020-02-12 ENCOUNTER — Encounter (HOSPITAL_COMMUNITY): Payer: Self-pay | Admitting: *Deleted

## 2020-02-12 DIAGNOSIS — R7989 Other specified abnormal findings of blood chemistry: Secondary | ICD-10-CM | POA: Insufficient documentation

## 2020-02-12 DIAGNOSIS — Y929 Unspecified place or not applicable: Secondary | ICD-10-CM | POA: Insufficient documentation

## 2020-02-12 DIAGNOSIS — T07XXXA Unspecified multiple injuries, initial encounter: Secondary | ICD-10-CM

## 2020-02-12 DIAGNOSIS — S21212A Laceration without foreign body of left back wall of thorax without penetration into thoracic cavity, initial encounter: Secondary | ICD-10-CM | POA: Insufficient documentation

## 2020-02-12 DIAGNOSIS — Y999 Unspecified external cause status: Secondary | ICD-10-CM | POA: Insufficient documentation

## 2020-02-12 DIAGNOSIS — S0101XA Laceration without foreign body of scalp, initial encounter: Secondary | ICD-10-CM | POA: Insufficient documentation

## 2020-02-12 DIAGNOSIS — F17210 Nicotine dependence, cigarettes, uncomplicated: Secondary | ICD-10-CM | POA: Insufficient documentation

## 2020-02-12 DIAGNOSIS — Y939 Activity, unspecified: Secondary | ICD-10-CM | POA: Insufficient documentation

## 2020-02-12 DIAGNOSIS — S41111A Laceration without foreign body of right upper arm, initial encounter: Secondary | ICD-10-CM | POA: Insufficient documentation

## 2020-02-12 DIAGNOSIS — R0602 Shortness of breath: Secondary | ICD-10-CM | POA: Insufficient documentation

## 2020-02-12 DIAGNOSIS — S21211A Laceration without foreign body of right back wall of thorax without penetration into thoracic cavity, initial encounter: Secondary | ICD-10-CM | POA: Insufficient documentation

## 2020-02-12 LAB — PROTIME-INR
INR: 1 (ref 0.8–1.2)
Prothrombin Time: 13.1 seconds (ref 11.4–15.2)

## 2020-02-12 LAB — I-STAT BETA HCG BLOOD, ED (MC, WL, AP ONLY): I-stat hCG, quantitative: <5 m[IU]/mL (ref <5)

## 2020-02-12 LAB — CBC
HCT: 41 % (ref 36.0–46.0)
Hemoglobin: 13.3 g/dL (ref 12.0–15.0)
MCH: 29.6 pg (ref 26.0–34.0)
MCHC: 32.4 g/dL (ref 30.0–36.0)
MCV: 91.1 fL (ref 80.0–100.0)
Platelets: 297 10*3/uL (ref 150–400)
RBC: 4.5 MIL/uL (ref 3.87–5.11)
RDW: 14.6 % (ref 11.5–15.5)
WBC: 13 10*3/uL — ABNORMAL HIGH (ref 4.0–10.5)
nRBC: 0 % (ref 0.0–0.2)

## 2020-02-12 LAB — COMPREHENSIVE METABOLIC PANEL
ALT: 18 U/L (ref 0–44)
AST: 20 U/L (ref 15–41)
Albumin: 3.9 g/dL (ref 3.5–5.0)
Alkaline Phosphatase: 81 U/L (ref 38–126)
Anion gap: 17 — ABNORMAL HIGH (ref 5–15)
BUN: 6 mg/dL (ref 6–20)
CO2: 15 mmol/L — ABNORMAL LOW (ref 22–32)
Calcium: 9 mg/dL (ref 8.9–10.3)
Chloride: 108 mmol/L (ref 98–111)
Creatinine, Ser: 0.71 mg/dL (ref 0.44–1.00)
GFR calc Af Amer: >60 mL/min (ref >60)
GFR calc non Af Amer: >60 mL/min (ref >60)
Glucose, Bld: 83 mg/dL (ref 70–99)
Potassium: 3.1 mmol/L — ABNORMAL LOW (ref 3.5–5.1)
Sodium: 140 mmol/L (ref 135–145)
Total Bilirubin: 0.6 mg/dL (ref 0.3–1.2)
Total Protein: 7.6 g/dL (ref 6.5–8.1)

## 2020-02-12 LAB — ETHANOL: Alcohol, Ethyl (B): 55 mg/dL — ABNORMAL HIGH (ref <10)

## 2020-02-12 LAB — SAMPLE TO BLOOD BANK

## 2020-02-12 LAB — LACTIC ACID, PLASMA
Lactic Acid, Venous: 8.3 mmol/L (ref 0.5–1.9)
Lactic Acid, Venous: 1.4 mmol/L (ref 0.5–1.9)

## 2020-02-12 LAB — I-STAT CHEM 8, ED
BUN: 4 mg/dL — ABNORMAL LOW (ref 6–20)
Calcium, Ion: 1.16 mmol/L (ref 1.15–1.40)
Chloride: 109 mmol/L (ref 98–111)
Creatinine, Ser: 0.7 mg/dL (ref 0.44–1.00)
Glucose, Bld: 75 mg/dL (ref 70–99)
HCT: 43 % (ref 36.0–46.0)
Hemoglobin: 14.6 g/dL (ref 12.0–15.0)
Potassium: 3.1 mmol/L — ABNORMAL LOW (ref 3.5–5.1)
Sodium: 142 mmol/L (ref 135–145)
TCO2: 18 mmol/L — ABNORMAL LOW (ref 22–32)

## 2020-02-12 LAB — CDS SEROLOGY

## 2020-02-12 MED ORDER — OXYCODONE HCL 5 MG PO TABS
10.0000 mg | ORAL_TABLET | Freq: Once | ORAL | Status: AC
  Administered 2020-02-12: 10 mg via ORAL
  Filled 2020-02-12: qty 2

## 2020-02-12 MED ORDER — LIDOCAINE-EPINEPHRINE (PF) 2 %-1:200000 IJ SOLN
10.0000 mL | Freq: Once | INTRAMUSCULAR | Status: AC
  Administered 2020-02-12: 10 mL via INTRADERMAL
  Filled 2020-02-12: qty 20

## 2020-02-12 MED ORDER — SODIUM CHLORIDE 0.9 % IV BOLUS (SEPSIS): 1000.0000 mL | Freq: Once | INTRAVENOUS | Status: DC

## 2020-02-12 MED ORDER — SODIUM CHLORIDE 0.9 % IV BOLUS (SEPSIS)
1000.0000 mL | Freq: Once | INTRAVENOUS | Status: AC
  Administered 2020-02-12: 1000 mL via INTRAVENOUS

## 2020-02-12 MED ORDER — MORPHINE SULFATE (PF) 4 MG/ML IV SOLN
4.0000 mg | Freq: Once | INTRAVENOUS | Status: AC
  Administered 2020-02-12: 4 mg via INTRAVENOUS
  Filled 2020-02-12: qty 1

## 2020-02-12 MED ORDER — SODIUM CHLORIDE 0.9 % IV SOLN: 1000.0000 mL | INTRAVENOUS | Status: DC

## 2020-02-12 NOTE — ED Provider Notes (Signed)
..Laceration Repair  Date/Time: 02/12/2020 5:51 AM Performed by: Dierdre Forth, PA-C Authorized by: Dierdre Forth, PA-C   Consent:    Consent obtained:  Verbal   Consent given by:  Patient   Risks discussed:  Infection, need for additional repair, pain, poor cosmetic result and poor wound healing   Alternatives discussed:  No treatment and delayed treatment Universal protocol:    Procedure explained and questions answered to patient or proxy's satisfaction: yes     Relevant documents present and verified: yes     Test results available and properly labeled: yes     Imaging studies available: yes     Required blood products, implants, devices, and special equipment available: yes     Site/side marked: yes     Immediately prior to procedure, a time out was called: yes     Patient identity confirmed:  Verbally with patient Laceration details:    Location:  Shoulder/arm   Shoulder/arm location:  R lower arm   Length (cm):  1 Repair type:    Repair type:  Simple Pre-procedure details:    Preparation:  Patient was prepped and draped in usual sterile fashion Exploration:    Hemostasis achieved with:  Direct pressure   Wound exploration: entire depth of wound probed and visualized   Treatment:    Area cleansed with:  Saline   Amount of cleaning:  Standard   Irrigation solution:  Sterile water   Irrigation method:  Syringe Skin repair:    Repair method:  Steri-Strips and tissue adhesive   Number of Steri-Strips:  1 Approximation:    Approximation:  Close Post-procedure details:    Dressing:  Open (no dressing)   Patient tolerance of procedure:  Tolerated well, no immediate complications  .Marland KitchenLaceration Repair  Date/Time: 02/12/2020 5:53 AM Performed by: Dierdre Forth, PA-C Authorized by: Dierdre Forth, PA-C   Consent:    Consent obtained:  Verbal   Consent given by:  Patient   Risks discussed:  Infection, need for additional repair, pain, poor  cosmetic result and poor wound healing   Alternatives discussed:  No treatment and delayed treatment Universal protocol:    Procedure explained and questions answered to patient or proxy's satisfaction: yes     Relevant documents present and verified: yes     Test results available and properly labeled: yes     Imaging studies available: yes     Required blood products, implants, devices, and special equipment available: yes     Site/side marked: yes     Immediately prior to procedure, a time out was called: yes     Patient identity confirmed:  Verbally with patient Laceration details:    Location:  Trunk   Trunk location:  Upper back   Length (cm):  0.5 Repair type:    Repair type:  Simple Exploration:    Hemostasis achieved with:  Direct pressure   Wound exploration: entire depth of wound probed and visualized   Treatment:    Area cleansed with:  Saline   Amount of cleaning:  Standard   Irrigation solution:  Sterile water   Irrigation method:  Syringe Skin repair:    Repair method:  Tissue adhesive Approximation:    Approximation:  Close Post-procedure details:    Dressing:  Open (no dressing)   Patient tolerance of procedure:  Tolerated well, no immediate complications .Marland KitchenLaceration Repair  Date/Time: 02/12/2020 5:53 AM Performed by: Dierdre Forth, PA-C Authorized by: Dierdre Forth, PA-C   Consent:    Consent  obtained:  Verbal   Consent given by:  Patient   Risks discussed:  Infection, need for additional repair, pain, poor cosmetic result and poor wound healing   Alternatives discussed:  No treatment and delayed treatment Universal protocol:    Procedure explained and questions answered to patient or proxy's satisfaction: yes     Relevant documents present and verified: yes     Test results available and properly labeled: yes     Imaging studies available: yes     Required blood products, implants, devices, and special equipment available: yes      Site/side marked: yes     Immediately prior to procedure, a time out was called: yes     Patient identity confirmed:  Verbally with patient Anesthesia (see MAR for exact dosages):    Anesthesia method:  Local infiltration   Local anesthetic:  Lidocaine 2% WITH epi Laceration details:    Location:  Trunk   Trunk location:  Upper back   Length (cm):  2 Repair type:    Repair type:  Simple Exploration:    Hemostasis achieved with:  Epinephrine and direct pressure   Wound exploration: entire depth of wound probed and visualized   Treatment:    Area cleansed with:  Saline   Amount of cleaning:  Standard   Irrigation solution:  Sterile water   Irrigation method:  Syringe Skin repair:    Repair method:  Sutures   Suture size:  4-0   Suture material:  Prolene   Suture technique:  Horizontal mattress   Number of sutures:  1 Approximation:    Approximation:  Loose Post-procedure details:    Dressing:  Non-adherent dressing   Patient tolerance of procedure:  Tolerated well, no immediate complications .Marland KitchenLaceration Repair  Date/Time: 02/12/2020 5:54 AM Performed by: Dierdre Forth, PA-C Authorized by: Dierdre Forth, PA-C   Consent:    Consent obtained:  Verbal   Consent given by:  Patient   Risks discussed:  Infection, need for additional repair, pain, poor cosmetic result and poor wound healing   Alternatives discussed:  No treatment and delayed treatment Universal protocol:    Procedure explained and questions answered to patient or proxy's satisfaction: yes     Relevant documents present and verified: yes     Test results available and properly labeled: yes     Imaging studies available: yes     Required blood products, implants, devices, and special equipment available: yes     Site/side marked: yes     Immediately prior to procedure, a time out was called: yes     Patient identity confirmed:  Verbally with patient Anesthesia (see MAR for exact dosages):     Anesthesia method:  Local infiltration   Local anesthetic:  Lidocaine 2% WITH epi Laceration details:    Location:  Scalp   Scalp location:  R parietal   Length (cm):  1.8 Repair type:    Repair type:  Simple Exploration:    Hemostasis achieved with:  Epinephrine and direct pressure   Wound exploration: entire depth of wound probed and visualized   Treatment:    Area cleansed with:  Saline   Amount of cleaning:  Standard   Irrigation solution:  Sterile water   Irrigation method:  Syringe Skin repair:    Repair method:  Staples   Number of staples:  2 Approximation:    Approximation:  Close Post-procedure details:    Dressing:  Open (no dressing)   Patient tolerance of procedure:  Tolerated well, no immediate complications                Agapito Games 02/12/20 [place], [place], MD 02/12/20 [phone]

## 2020-02-12 NOTE — ED Notes (Signed)
ED Provider at bedside to evaluate wounds
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
## 2020-02-12 NOTE — ED Notes (Signed)
Pt able to ambulate to and from bathroom without assistance, no endorsement of dizziness or impaired gait.
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
## 2023-10-03 ENCOUNTER — Ambulatory Visit: Payer: Self-pay | Admitting: Family Medicine

## 2023-10-24 ENCOUNTER — Ambulatory Visit: Payer: Self-pay | Admitting: Family Medicine

## 2023-12-02 ENCOUNTER — Ambulatory Visit: Payer: Self-pay | Admitting: Family Medicine

## 2024-01-06 ENCOUNTER — Ambulatory Visit: Payer: No Typology Code available for payment source | Admitting: Family Medicine
